# Patient Record
Sex: Male | Born: 1950 | Race: White | Hispanic: No | Marital: Married | Smoking: Former smoker
Health system: Southern US, Community
[De-identification: ages and names within clinical notes are randomized; demographics above are authoritative.]

## PROBLEM LIST (undated history)

## (undated) DIAGNOSIS — J8410 Pulmonary fibrosis, unspecified: Secondary | ICD-10-CM

## (undated) HISTORY — PX: OTHER SURGICAL HISTORY: SHX169

## (undated) HISTORY — PX: SHOULDER ARTHROSCOPY: SHX128

## (undated) HISTORY — DX: Pulmonary fibrosis, unspecified: J84.10

---

## 2009-10-26 ENCOUNTER — Emergency Department (HOSPITAL_BASED_OUTPATIENT_CLINIC_OR_DEPARTMENT_OTHER): Admission: EM | Admit: 2009-10-26 | Discharge: 2009-10-26 | Payer: Self-pay | Admitting: Emergency Medicine

## 2009-11-08 ENCOUNTER — Ambulatory Visit: Payer: Self-pay | Admitting: Family

## 2009-11-08 DIAGNOSIS — F17200 Nicotine dependence, unspecified, uncomplicated: Secondary | ICD-10-CM

## 2009-11-08 DIAGNOSIS — M542 Cervicalgia: Secondary | ICD-10-CM

## 2009-11-28 ENCOUNTER — Telehealth: Payer: Self-pay | Admitting: Family

## 2009-11-29 ENCOUNTER — Ambulatory Visit: Payer: Self-pay | Admitting: Radiology

## 2009-11-29 ENCOUNTER — Ambulatory Visit (HOSPITAL_BASED_OUTPATIENT_CLINIC_OR_DEPARTMENT_OTHER): Admission: RE | Admit: 2009-11-29 | Discharge: 2009-11-29 | Payer: Self-pay | Admitting: Family

## 2009-12-02 ENCOUNTER — Telehealth: Payer: Self-pay | Admitting: Family

## 2009-12-10 ENCOUNTER — Encounter: Payer: Self-pay | Admitting: Internal Medicine

## 2009-12-10 ENCOUNTER — Encounter: Admission: RE | Admit: 2009-12-10 | Discharge: 2010-01-21 | Payer: Self-pay | Admitting: Internal Medicine

## 2010-08-31 ENCOUNTER — Encounter: Payer: Self-pay | Admitting: Internal Medicine

## 2010-09-09 NOTE — Progress Notes (Signed)
  Phone Note Call from Patient   Caller: Patient Details for Reason: cervical MRI  Summary of Call: Ins sent pt a denial letter inreferance to  pt's MRI   stating he has not had any other   treatment  x-ray -ct or anti-inflamatory meds .Please advise. Initial call taken by: Darral Dash,  November 28, 2009 8:32 AM  Follow-up for Phone Call        Called patient.  He tells me that his neck pain is unchanged.  MRI was denied.  Will send for X-ray of the C spine.  Pt wishes to do tomorrow.  Will leave slips with the front desk in radiology department.  Follow-up by: Lemont Fillers FNP,  November 28, 2009 10:52 AM

## 2010-09-09 NOTE — Assessment & Plan Note (Signed)
Summary: new to be est/mhf   Vital Signs:  Patient profile:   60 year old male Height:      72 inches Weight:      191 pounds BMI:     26.00 Temp:     97.8 degrees F oral Pulse rate:   108 / minute Pulse rhythm:   regular Resp:     18 per minute BP sitting:   128 / 80  (right arm) Cuff size:   regular  Vitals Entered By: Anthony Wilkerson CMA (November 08, 2009 1:40 PM) CC: room 4   Neck pain x 3 1/2 months. Pt was seen in ER 10/26/09. Is Patient Diabetic? No   CC:  room 4   Neck pain x 3 1/2 months. Pt was seen in ER 10/26/09.Marland Kitchen  History of Present Illness: Anthony Wilkerson is a 60 year old male who presents today to establish care.  Notes that he has had neck pain x 3.5 months.  Did have a fall 6 months ago.  Otherwise, no known trauma.  Pain is worse with movement- especially turning head side to side. Denies pain when at rest.  Has not tried any OTC meds.  Pain is worse in the morning.  Denies any associated hand numbness or weakness.   Patient tells me that he has not had a primary care physician since 1995.  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 Lungs:  Normal respiratory effort, chest expands symmetrically. Lungs are clear to auscultation, no crackles or wheezes. Heart:  Normal rate and regular rhythm. S1 and S2 normal without gallop, murmur, click, rub or other extra sounds. Msk:  Patient with some difficulty fully turning head to right and left.   Extremities:  +clubbing Neurologic:  strength normal in all extremities.   Skin:  + psoriasis noted on hands   Impression & Recommendations:  Problem # 1:  NECK PAIN (ICD-723.1) Assessment New Patient is requesting MRI, will request prior authorization.  If this is not approved, will order plain films.  Patient aware of plan.  I due suspect that patient's symptoms are related to arthritis- I suggested NSAIDS.  Pt will also need a preventative care visit scheduled.   Orders: Misc. Referral (Misc. Ref)  Problem # 2:  TOBACCO ABUSE (ICD-305.1) Assessment: Comment Only  Pt noted to have + clubbing on exam.  I counseled patient on importance of quitting smoking.   Orders: Tobacco  use cessation intermediate 3-10 minutes ([id number])  Patient Instructions: 1)  Please follow up fasting for a complete physical. 2)  You will be contacted about your referral for MRI. 3)  Take 400-600mg  of Ibuprofen (Advil, Motrin) with food every 4-6 hours as needed for relief of pain. 4)  Tobacco is very bad for your health and your loved ones! You Should stop smoking!. 5)  It was a pleasure to meet you!

## 2010-09-09 NOTE — Miscellaneous (Signed)
Summary: PT Initial Summary/[facility]  PT Initial Summary/[facility]   Imported By: Lanelle Bal 12/16/2009 11:12:47  _____________________________________________________________________  External Attachment:    Type:   Image     Comment:   External Document

## 2010-09-09 NOTE — Progress Notes (Signed)
  Phone Note Outgoing Call   Summary of Call: Called patient, reviewed results of C-Spine X-ray with patient (DDD- moderate foraminal narrowing).  Pt notes that his pain is unchanged.  He has not tried NSAIDS.  I recommended Aleve and PT referral.  Patient agrees. I recommended follow up in 1-2 months. Initial call taken by: Lemont Fillers FNP,  December 02, 2009 9:47 AM

## 2011-07-07 IMAGING — CR DG CERVICAL SPINE WITH FLEX & EXTEND
7 series · 7 of 7 positions shown · non-contrast
Comparison: None.

CLINICAL DATA: Neck pain - no known injury

CERVICAL SPINE COMPLETE WITH FLEXION AND EXTENSION VIEWS

[w c-spine lat]
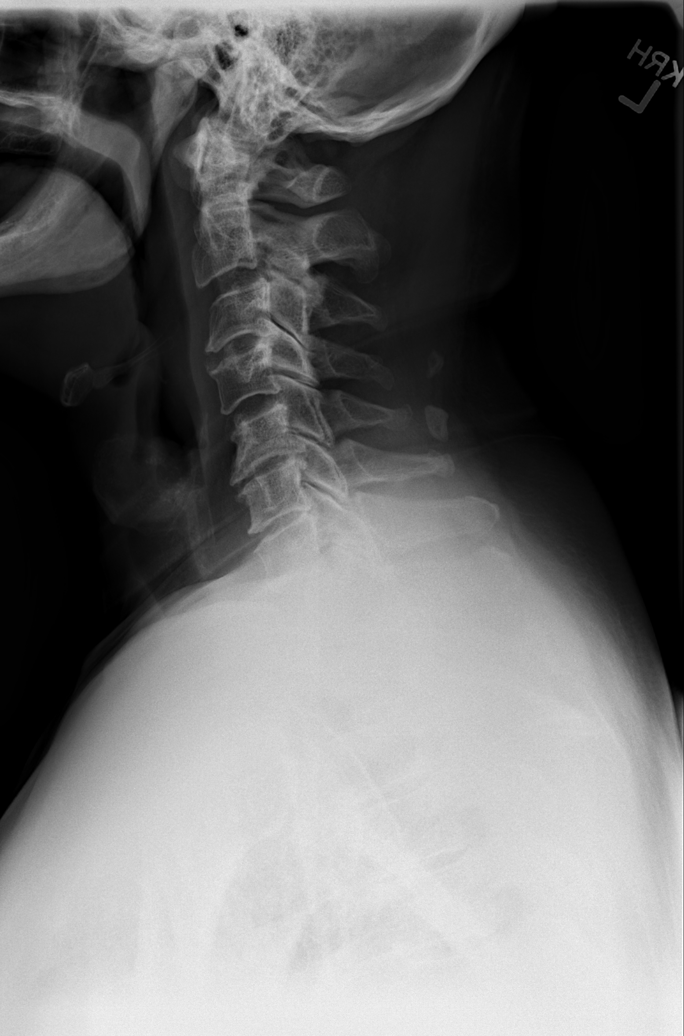

[w c-spine flexion]
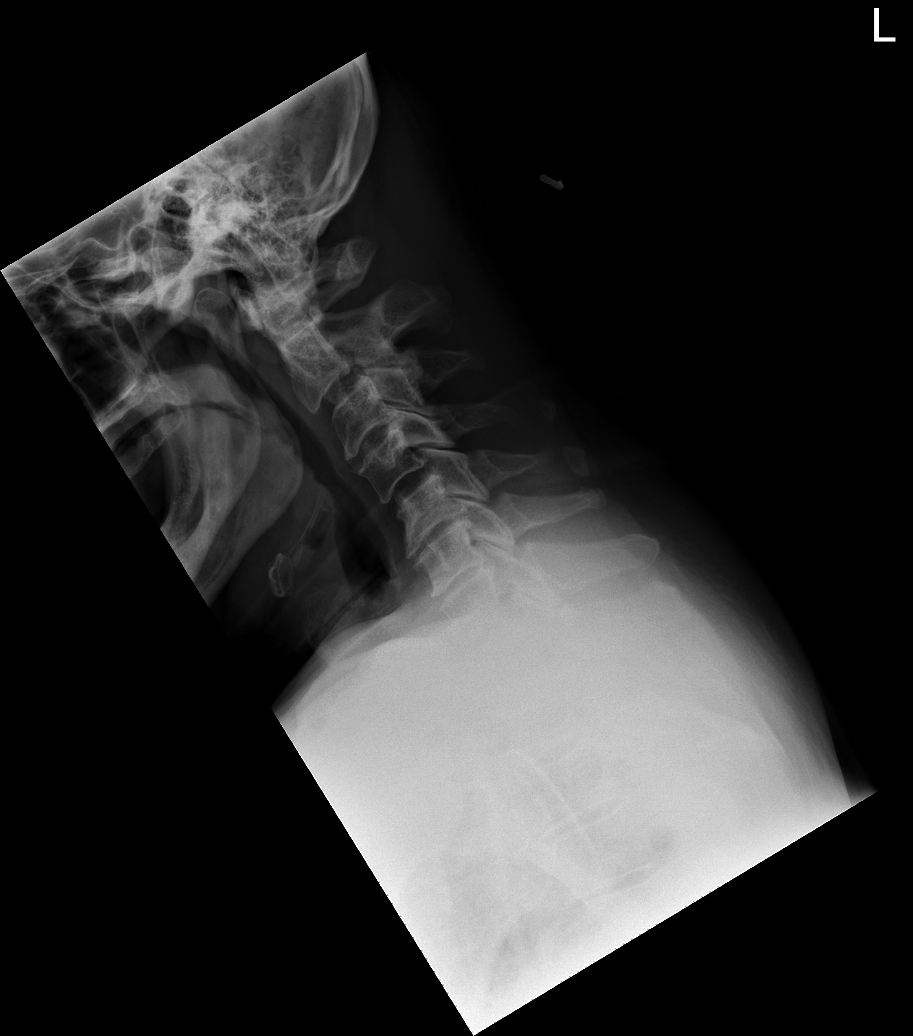

[w c-spine extension]
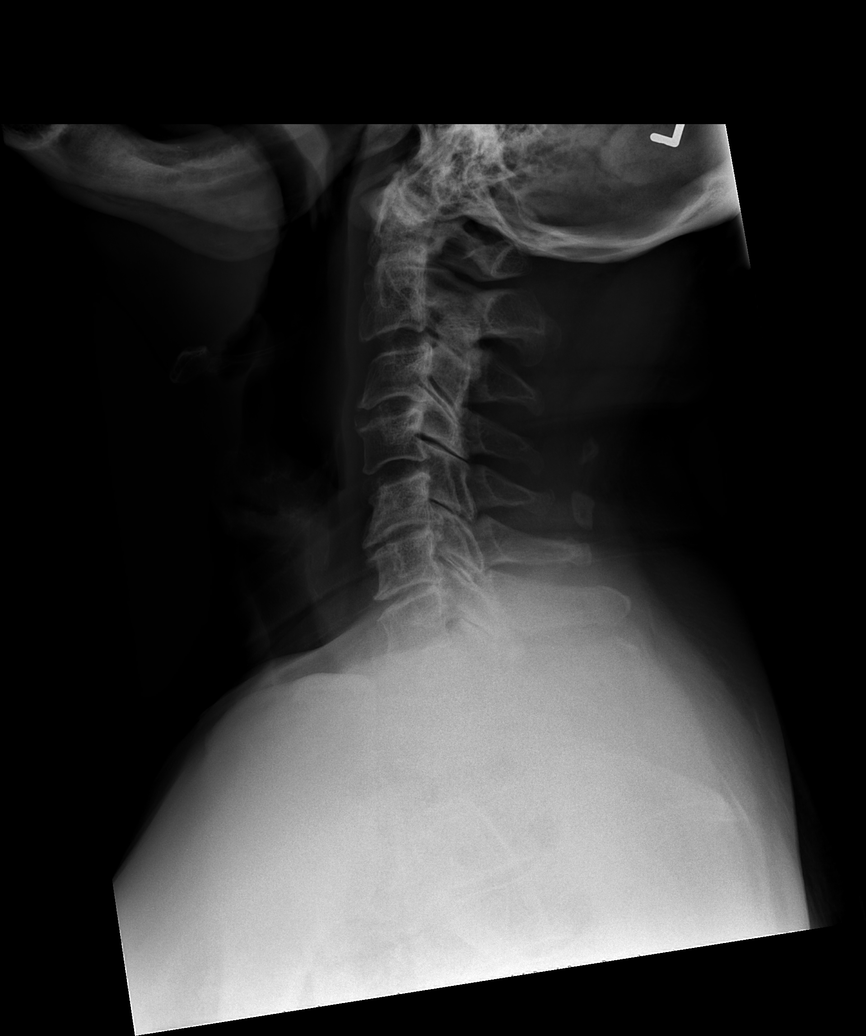

[w c-spine oblique (1 of 2)]
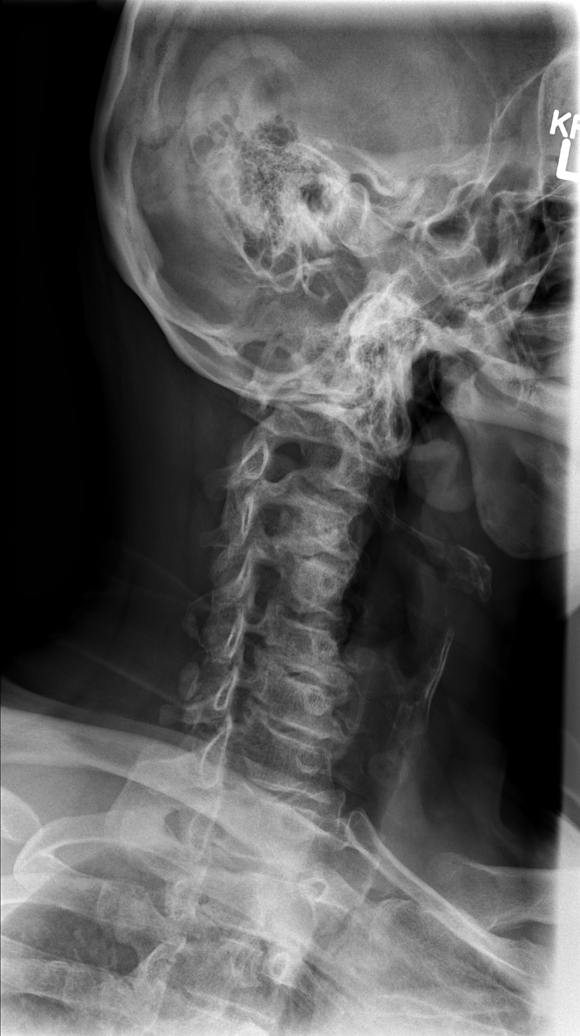

[w c-spine oblique (2 of 2)]
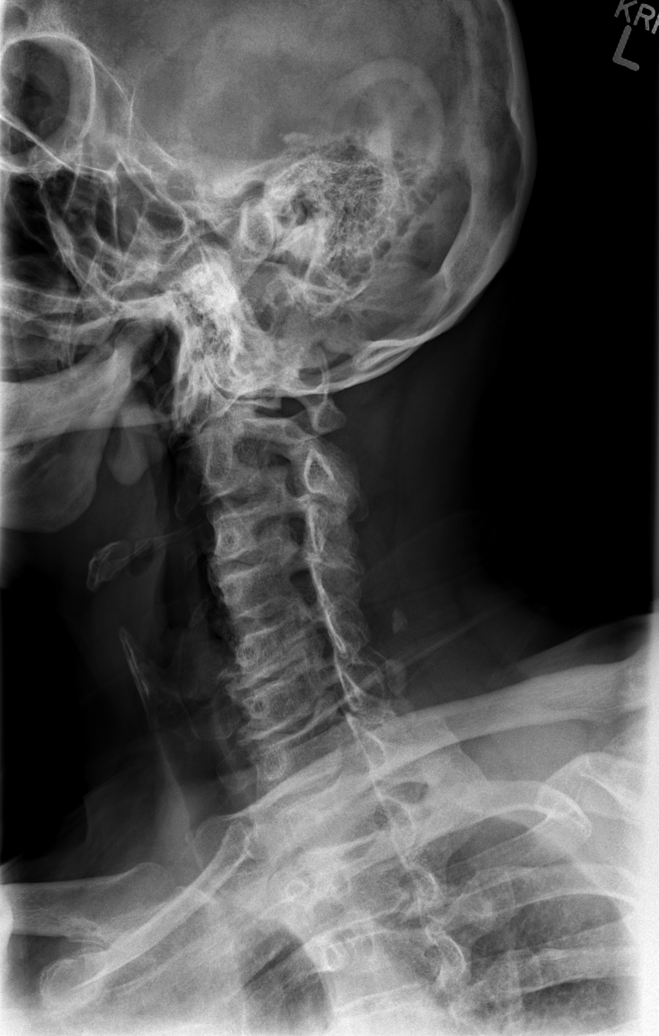

[w c-spine a.p.]
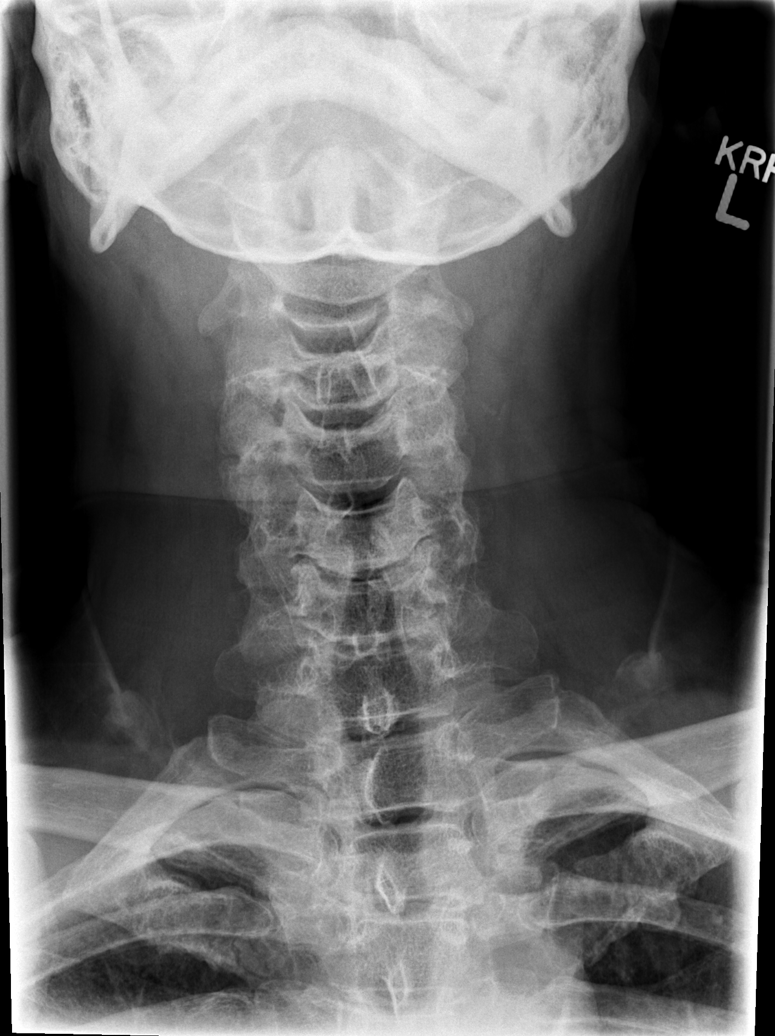

[w c-spine odontoid]
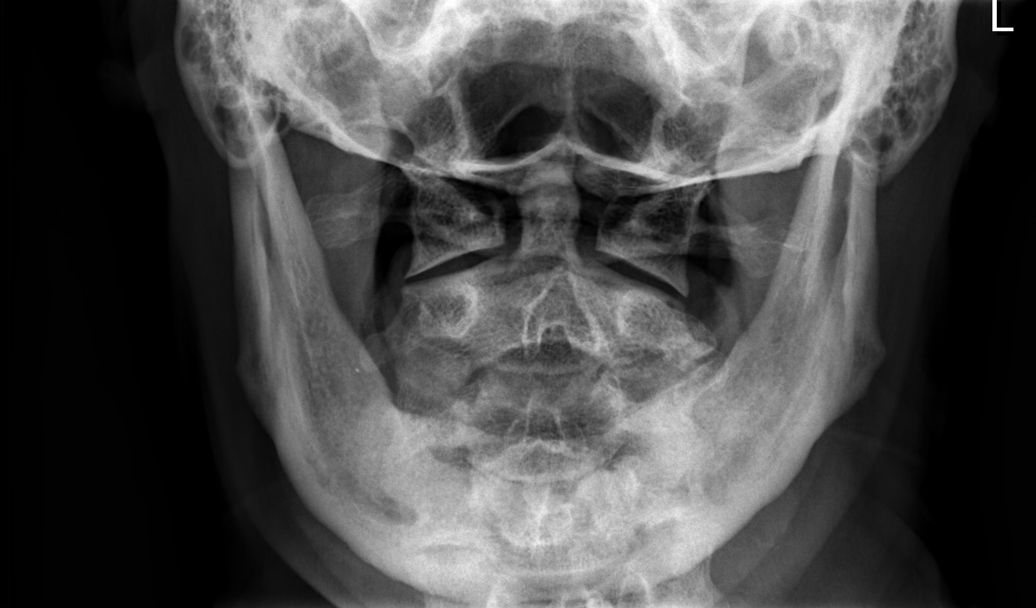

[7 of 7 positions shown; findings below may reference images not displayed]

FINDINGS: Routine views plus lateral views with flexion and
extension were obtained.

There is degenerative disc disease most notably at C5-6 and C6-7.
There is moderate foraminal narrowing on the right at these levels.
No fracture or subluxation.  No instability demonstrated through
flexion or extension.
IMPRESSION: Degenerative changes as above.  No acute findings.

## 2016-08-17 DIAGNOSIS — Z79899 Other long term (current) drug therapy: Secondary | ICD-10-CM | POA: Diagnosis not present

## 2016-08-17 DIAGNOSIS — J984 Other disorders of lung: Secondary | ICD-10-CM | POA: Diagnosis not present

## 2016-08-17 DIAGNOSIS — J449 Chronic obstructive pulmonary disease, unspecified: Secondary | ICD-10-CM | POA: Diagnosis not present

## 2016-08-17 DIAGNOSIS — J84112 Idiopathic pulmonary fibrosis: Secondary | ICD-10-CM | POA: Diagnosis not present

## 2016-08-17 DIAGNOSIS — J849 Interstitial pulmonary disease, unspecified: Secondary | ICD-10-CM | POA: Diagnosis not present

## 2016-10-24 DIAGNOSIS — L409 Psoriasis, unspecified: Secondary | ICD-10-CM | POA: Diagnosis not present

## 2016-10-31 DIAGNOSIS — L403 Pustulosis palmaris et plantaris: Secondary | ICD-10-CM | POA: Diagnosis not present

## 2016-11-20 DIAGNOSIS — J849 Interstitial pulmonary disease, unspecified: Secondary | ICD-10-CM | POA: Diagnosis not present

## 2016-11-20 DIAGNOSIS — J84112 Idiopathic pulmonary fibrosis: Secondary | ICD-10-CM | POA: Diagnosis not present

## 2016-11-20 DIAGNOSIS — Z79899 Other long term (current) drug therapy: Secondary | ICD-10-CM | POA: Diagnosis not present

## 2016-11-20 DIAGNOSIS — L409 Psoriasis, unspecified: Secondary | ICD-10-CM | POA: Diagnosis not present

## 2016-11-20 DIAGNOSIS — J449 Chronic obstructive pulmonary disease, unspecified: Secondary | ICD-10-CM | POA: Diagnosis not present

## 2016-11-20 DIAGNOSIS — J984 Other disorders of lung: Secondary | ICD-10-CM | POA: Diagnosis not present

## 2016-12-25 DIAGNOSIS — L400 Psoriasis vulgaris: Secondary | ICD-10-CM | POA: Diagnosis not present

## 2016-12-25 DIAGNOSIS — L409 Psoriasis, unspecified: Secondary | ICD-10-CM | POA: Diagnosis not present

## 2017-01-27 DIAGNOSIS — R0602 Shortness of breath: Secondary | ICD-10-CM | POA: Diagnosis not present

## 2017-01-27 DIAGNOSIS — J8410 Pulmonary fibrosis, unspecified: Secondary | ICD-10-CM | POA: Diagnosis not present

## 2017-01-27 DIAGNOSIS — J84113 Idiopathic non-specific interstitial pneumonitis: Secondary | ICD-10-CM | POA: Diagnosis not present

## 2017-01-27 DIAGNOSIS — Z79899 Other long term (current) drug therapy: Secondary | ICD-10-CM | POA: Diagnosis not present

## 2017-01-27 DIAGNOSIS — L409 Psoriasis, unspecified: Secondary | ICD-10-CM | POA: Diagnosis not present

## 2017-01-27 DIAGNOSIS — J84111 Idiopathic interstitial pneumonia, not otherwise specified: Secondary | ICD-10-CM | POA: Diagnosis not present

## 2017-01-27 DIAGNOSIS — J431 Panlobular emphysema: Secondary | ICD-10-CM | POA: Diagnosis not present

## 2017-01-27 DIAGNOSIS — F17210 Nicotine dependence, cigarettes, uncomplicated: Secondary | ICD-10-CM | POA: Diagnosis not present

## 2017-02-06 DIAGNOSIS — M545 Low back pain: Secondary | ICD-10-CM | POA: Diagnosis not present

## 2017-03-05 DIAGNOSIS — J431 Panlobular emphysema: Secondary | ICD-10-CM | POA: Diagnosis not present

## 2017-03-05 DIAGNOSIS — L409 Psoriasis, unspecified: Secondary | ICD-10-CM | POA: Diagnosis not present

## 2017-03-05 DIAGNOSIS — J849 Interstitial pulmonary disease, unspecified: Secondary | ICD-10-CM | POA: Diagnosis not present

## 2017-03-11 DIAGNOSIS — L270 Generalized skin eruption due to drugs and medicaments taken internally: Secondary | ICD-10-CM | POA: Diagnosis not present

## 2017-03-12 DIAGNOSIS — L309 Dermatitis, unspecified: Secondary | ICD-10-CM | POA: Diagnosis not present

## 2017-03-12 DIAGNOSIS — L270 Generalized skin eruption due to drugs and medicaments taken internally: Secondary | ICD-10-CM | POA: Diagnosis not present

## 2017-04-05 DIAGNOSIS — J84113 Idiopathic non-specific interstitial pneumonitis: Secondary | ICD-10-CM | POA: Diagnosis not present

## 2017-05-05 DIAGNOSIS — L570 Actinic keratosis: Secondary | ICD-10-CM | POA: Diagnosis not present

## 2017-05-05 DIAGNOSIS — L561 Drug photoallergic response: Secondary | ICD-10-CM | POA: Diagnosis not present

## 2017-05-05 DIAGNOSIS — L400 Psoriasis vulgaris: Secondary | ICD-10-CM | POA: Diagnosis not present

## 2017-05-15 DIAGNOSIS — J439 Emphysema, unspecified: Secondary | ICD-10-CM | POA: Diagnosis not present

## 2017-05-15 DIAGNOSIS — R918 Other nonspecific abnormal finding of lung field: Secondary | ICD-10-CM | POA: Diagnosis not present

## 2017-07-28 DIAGNOSIS — L409 Psoriasis, unspecified: Secondary | ICD-10-CM | POA: Diagnosis not present

## 2017-07-28 DIAGNOSIS — J431 Panlobular emphysema: Secondary | ICD-10-CM | POA: Diagnosis not present

## 2017-07-28 DIAGNOSIS — L568 Other specified acute skin changes due to ultraviolet radiation: Secondary | ICD-10-CM | POA: Diagnosis not present

## 2017-07-28 DIAGNOSIS — J84111 Idiopathic interstitial pneumonia, not otherwise specified: Secondary | ICD-10-CM | POA: Diagnosis not present

## 2017-07-28 DIAGNOSIS — R0602 Shortness of breath: Secondary | ICD-10-CM | POA: Diagnosis not present

## 2017-07-30 DIAGNOSIS — H43813 Vitreous degeneration, bilateral: Secondary | ICD-10-CM | POA: Diagnosis not present

## 2017-07-30 DIAGNOSIS — H2513 Age-related nuclear cataract, bilateral: Secondary | ICD-10-CM | POA: Diagnosis not present

## 2017-08-11 DIAGNOSIS — L409 Psoriasis, unspecified: Secondary | ICD-10-CM | POA: Diagnosis not present

## 2017-08-11 DIAGNOSIS — J8410 Pulmonary fibrosis, unspecified: Secondary | ICD-10-CM | POA: Diagnosis not present

## 2017-08-11 DIAGNOSIS — E782 Mixed hyperlipidemia: Secondary | ICD-10-CM | POA: Diagnosis not present

## 2017-08-11 DIAGNOSIS — L400 Psoriasis vulgaris: Secondary | ICD-10-CM | POA: Diagnosis not present

## 2017-08-11 DIAGNOSIS — E781 Pure hyperglyceridemia: Secondary | ICD-10-CM | POA: Diagnosis not present

## 2017-11-08 DIAGNOSIS — E781 Pure hyperglyceridemia: Secondary | ICD-10-CM | POA: Diagnosis not present

## 2017-11-08 DIAGNOSIS — L409 Psoriasis, unspecified: Secondary | ICD-10-CM | POA: Diagnosis not present

## 2017-11-08 DIAGNOSIS — Z0000 Encounter for general adult medical examination without abnormal findings: Secondary | ICD-10-CM | POA: Diagnosis not present

## 2017-11-08 DIAGNOSIS — E782 Mixed hyperlipidemia: Secondary | ICD-10-CM | POA: Diagnosis not present

## 2017-12-06 DIAGNOSIS — Z5320 Procedure and treatment not carried out because of patient's decision for unspecified reasons: Secondary | ICD-10-CM | POA: Diagnosis not present

## 2017-12-06 DIAGNOSIS — L409 Psoriasis, unspecified: Secondary | ICD-10-CM | POA: Diagnosis not present

## 2017-12-06 DIAGNOSIS — E119 Type 2 diabetes mellitus without complications: Secondary | ICD-10-CM | POA: Diagnosis not present

## 2017-12-06 DIAGNOSIS — E1165 Type 2 diabetes mellitus with hyperglycemia: Secondary | ICD-10-CM | POA: Diagnosis not present

## 2017-12-06 DIAGNOSIS — R7301 Impaired fasting glucose: Secondary | ICD-10-CM | POA: Diagnosis not present

## 2018-01-31 DIAGNOSIS — L409 Psoriasis, unspecified: Secondary | ICD-10-CM | POA: Diagnosis not present

## 2018-01-31 DIAGNOSIS — J431 Panlobular emphysema: Secondary | ICD-10-CM | POA: Diagnosis not present

## 2018-01-31 DIAGNOSIS — R21 Rash and other nonspecific skin eruption: Secondary | ICD-10-CM | POA: Diagnosis not present

## 2018-01-31 DIAGNOSIS — J84112 Idiopathic pulmonary fibrosis: Secondary | ICD-10-CM | POA: Diagnosis not present

## 2018-01-31 DIAGNOSIS — Z87891 Personal history of nicotine dependence: Secondary | ICD-10-CM | POA: Diagnosis not present

## 2018-01-31 DIAGNOSIS — J84111 Idiopathic interstitial pneumonia, not otherwise specified: Secondary | ICD-10-CM | POA: Diagnosis not present

## 2018-03-07 DIAGNOSIS — Z7984 Long term (current) use of oral hypoglycemic drugs: Secondary | ICD-10-CM | POA: Diagnosis not present

## 2018-03-07 DIAGNOSIS — J8410 Pulmonary fibrosis, unspecified: Secondary | ICD-10-CM | POA: Diagnosis not present

## 2018-03-07 DIAGNOSIS — L400 Psoriasis vulgaris: Secondary | ICD-10-CM | POA: Diagnosis not present

## 2018-03-07 DIAGNOSIS — E1165 Type 2 diabetes mellitus with hyperglycemia: Secondary | ICD-10-CM | POA: Diagnosis not present

## 2018-03-07 DIAGNOSIS — E781 Pure hyperglyceridemia: Secondary | ICD-10-CM | POA: Diagnosis not present

## 2018-05-31 DIAGNOSIS — M25511 Pain in right shoulder: Secondary | ICD-10-CM | POA: Diagnosis not present

## 2018-05-31 DIAGNOSIS — S46211D Strain of muscle, fascia and tendon of other parts of biceps, right arm, subsequent encounter: Secondary | ICD-10-CM | POA: Diagnosis not present

## 2018-06-10 DIAGNOSIS — S46811A Strain of other muscles, fascia and tendons at shoulder and upper arm level, right arm, initial encounter: Secondary | ICD-10-CM | POA: Diagnosis not present

## 2018-06-10 DIAGNOSIS — M19011 Primary osteoarthritis, right shoulder: Secondary | ICD-10-CM | POA: Diagnosis not present

## 2018-06-10 DIAGNOSIS — M7521 Bicipital tendinitis, right shoulder: Secondary | ICD-10-CM | POA: Diagnosis not present

## 2018-06-10 DIAGNOSIS — M25411 Effusion, right shoulder: Secondary | ICD-10-CM | POA: Diagnosis not present

## 2018-06-10 DIAGNOSIS — S46011A Strain of muscle(s) and tendon(s) of the rotator cuff of right shoulder, initial encounter: Secondary | ICD-10-CM | POA: Diagnosis not present

## 2018-06-10 DIAGNOSIS — M7581 Other shoulder lesions, right shoulder: Secondary | ICD-10-CM | POA: Diagnosis not present

## 2018-08-08 DIAGNOSIS — M25511 Pain in right shoulder: Secondary | ICD-10-CM | POA: Diagnosis not present

## 2018-08-20 DIAGNOSIS — M25511 Pain in right shoulder: Secondary | ICD-10-CM | POA: Diagnosis not present

## 2018-08-24 DIAGNOSIS — M25511 Pain in right shoulder: Secondary | ICD-10-CM | POA: Diagnosis not present

## 2018-09-12 DIAGNOSIS — E1165 Type 2 diabetes mellitus with hyperglycemia: Secondary | ICD-10-CM | POA: Diagnosis not present

## 2018-09-12 DIAGNOSIS — L400 Psoriasis vulgaris: Secondary | ICD-10-CM | POA: Diagnosis not present

## 2018-09-12 DIAGNOSIS — J8410 Pulmonary fibrosis, unspecified: Secondary | ICD-10-CM | POA: Diagnosis not present

## 2018-09-12 DIAGNOSIS — E782 Mixed hyperlipidemia: Secondary | ICD-10-CM | POA: Diagnosis not present

## 2019-01-05 DIAGNOSIS — R21 Rash and other nonspecific skin eruption: Secondary | ICD-10-CM | POA: Diagnosis not present

## 2019-01-05 DIAGNOSIS — J431 Panlobular emphysema: Secondary | ICD-10-CM | POA: Diagnosis not present

## 2019-01-05 DIAGNOSIS — L409 Psoriasis, unspecified: Secondary | ICD-10-CM | POA: Diagnosis not present

## 2019-01-05 DIAGNOSIS — R942 Abnormal results of pulmonary function studies: Secondary | ICD-10-CM | POA: Diagnosis not present

## 2019-01-05 DIAGNOSIS — Z87891 Personal history of nicotine dependence: Secondary | ICD-10-CM | POA: Diagnosis not present

## 2019-01-05 DIAGNOSIS — J84111 Idiopathic interstitial pneumonia, not otherwise specified: Secondary | ICD-10-CM | POA: Diagnosis not present

## 2019-01-05 DIAGNOSIS — J84112 Idiopathic pulmonary fibrosis: Secondary | ICD-10-CM | POA: Diagnosis not present

## 2019-01-05 DIAGNOSIS — R0602 Shortness of breath: Secondary | ICD-10-CM | POA: Diagnosis not present

## 2019-02-01 ENCOUNTER — Other Ambulatory Visit: Payer: Self-pay

## 2019-02-01 ENCOUNTER — Ambulatory Visit (INDEPENDENT_AMBULATORY_CARE_PROVIDER_SITE_OTHER): Payer: Worker's Compensation | Admitting: Orthopaedic Surgery

## 2019-02-01 ENCOUNTER — Encounter: Payer: Self-pay | Admitting: Orthopaedic Surgery

## 2019-02-01 VITALS — BP 125/73 | HR 68 | Ht 71.0 in | Wt 197.0 lb

## 2019-02-01 DIAGNOSIS — G8929 Other chronic pain: Secondary | ICD-10-CM

## 2019-02-01 DIAGNOSIS — M25511 Pain in right shoulder: Secondary | ICD-10-CM

## 2019-02-01 DIAGNOSIS — M25512 Pain in left shoulder: Secondary | ICD-10-CM

## 2019-02-01 NOTE — Progress Notes (Addendum)
Office Visit Note   Patient: Anthony Wilkerson           Date of Birth: 11/22/1950           MRN: [id number] Visit Date: 02/01/2019              Requested by: No referring provider defined for this encounter. PCP: Patient, No Pcp Per   Assessment & Plan: Visit Diagnoses:  1. Chronic right shoulder pain   2. Chronic left shoulder pain     Plan: On-the-job injury over 4 months ago with some persistent left shoulder pain.  MRI scan demonstrates supraspinatus partial thickness partial width tear as well as infraspinatus tendinosis with mild partial tearing there is some tendinosis of the subscapularis.  Biceps is intact with some tendinosis.  There is also extensive AC joint degenerative changes as well as subacromial bursitis.  There is a small glenohumeral joint effusion.  I discussed at length with Anthony Wilkerson all of the above including the diagnosis and different treatment options including another course of physical therapy, cortisone injections, anti-inflammatory medicines or arthroscopic debridement.  Anthony Wilkerson would like to give it more time continue with over-the-counter medicines.  We will reevaluate in 6 weeks.  Would like to continue to work without restrictions and I think is fine. Office visit over 45 minutes.  I spent approximately 45 minutes reviewing all of his records and scan reports that were previously forwarded to the office  Follow-Up Instructions: Return in about 6 weeks (around 03/15/2019).   Orders:  No orders of the defined types were placed in this encounter.  No orders of the defined types were placed in this encounter.     Procedures: No procedures performed   Clinical Data: No additional findings.   Subjective: Chief Complaint  Patient presents with  . Left Shoulder - Pain  Patient presents today for an IME on his left shoulder. He injured his shoulder on 10/21/2018 when he was moving a piece of kitchen equipment. He said that afterwards he started  feeling pain in his shoulder. He had x-rays and an MRI. He has went through therapy, no improvement. He takes 600mg  Ibuprofen four times daily.  He has some weakness in his arm. His range of motion is okay as long as he goes slowly.  Anthony Wilkerson is 68 years old and injured his left shoulder while on the job on 21 October 2018.  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  He was pushing a 252  to300 pound frier across the floor when he experienced immediate onset of pain in the left shoulder.  At the time he was in [place].  With persistent pain and about a week later he was evaluated in [place] where he lives by [place].  He was diagnosed with a shoulder strain and referred to physical therapy.  He underwent a course of 6 sessions.  He has been furloughed over the last several months because of the COVID virus and has not been active with his shoulder.  He notes he still has some discomfort with certain activities but is better.  His past history is significant that he underwent an open procedure to the same shoulder while living in [place] in 1994.  He was having little or any discomfort with that shoulder prior to this particular injury.  He is not had any numbness or tingling.  He does have a history of psoriasis.  I have reviewed his office notes  as well as his MRI scan report and discussed them with Anthony Wilkerson.  He plans to return to work tomorrow. He has a second Workmen's Comp. claim referable to his right shoulder and appears to have been seen by Dr. Rennis ChrisSupple at [facility]  HPI  Review of Systems   Objective: Vital Signs: BP 125/73   Pulse 68   Ht 5\' 11"  (1.803 m)   Wt 197 lb (89.4 kg)   BMI 27.48 kg/m   Physical Exam Constitutional:      Appearance: He is well-developed.  Eyes:     Pupils: Pupils are equal, round, and reactive to light.  Pulmonary:     Effort: Pulmonary effort is normal.  Skin:    General: Skin is warm and dry.   Neurological:     Mental Status: He is alert and oriented to person, place, and time.  Psychiatric:        Behavior: Behavior normal.     Ortho Exam psoriatic lesions in both arms.  Left shoulder with minimally positive impingement testing.  Able to place arm fully overhead with slightly circuitous motion.  Minimal loss of internal rotation.  Good strength.  Biceps intact shoulder skin intact.  Negative empty can testing.  No significant tenderness over the [facility] but there is some bulky change.  Minimal discomfort in the anterior subacromial region.  No pain with range of motion of the cervical spine.  Good grip and good release.  Specialty Comments:  No specialty comments available.  Imaging: No results found.   PMFS History: Patient Active Problem List   Diagnosis Date Noted  . Pain in right shoulder 03/15/2019  . TOBACCO ABUSE 11/08/2009  . NECK PAIN 11/08/2009   Past Medical History:  Diagnosis Date  . Pulmonary fibrosis (H[facility])     History reviewed. No pertinent family history.  Past Surgical History:  Procedure Laterality Date  . pulmonary fibrosis     ERROR  . SHOULDER ARTHROSCOPY Left    Social History   Occupational History  . Not on file  Tobacco Use  . Smoking status: Former Games developermoker  . Smokeless tobacco: Never Used  Substance and Sexual Activity  . Alcohol use: Yes  . Drug use: Not on file  . Sexual activity: Not on file

## 2019-03-15 ENCOUNTER — Encounter: Payer: Self-pay | Admitting: Orthopaedic Surgery

## 2019-03-15 ENCOUNTER — Other Ambulatory Visit: Payer: Self-pay

## 2019-03-15 ENCOUNTER — Ambulatory Visit (INDEPENDENT_AMBULATORY_CARE_PROVIDER_SITE_OTHER): Payer: Worker's Compensation | Admitting: Orthopaedic Surgery

## 2019-03-15 ENCOUNTER — Telehealth: Payer: Self-pay

## 2019-03-15 VITALS — BP 116/69 | HR 58 | Ht 71.0 in | Wt 197.0 lb

## 2019-03-15 DIAGNOSIS — M25511 Pain in right shoulder: Secondary | ICD-10-CM | POA: Insufficient documentation

## 2019-03-15 DIAGNOSIS — E782 Mixed hyperlipidemia: Secondary | ICD-10-CM | POA: Diagnosis not present

## 2019-03-15 DIAGNOSIS — E1165 Type 2 diabetes mellitus with hyperglycemia: Secondary | ICD-10-CM | POA: Diagnosis not present

## 2019-03-15 DIAGNOSIS — M25512 Pain in left shoulder: Secondary | ICD-10-CM

## 2019-03-15 DIAGNOSIS — G8929 Other chronic pain: Secondary | ICD-10-CM | POA: Diagnosis not present

## 2019-03-15 DIAGNOSIS — L409 Psoriasis, unspecified: Secondary | ICD-10-CM | POA: Diagnosis not present

## 2019-03-15 DIAGNOSIS — J8410 Pulmonary fibrosis, unspecified: Secondary | ICD-10-CM | POA: Diagnosis not present

## 2019-03-15 NOTE — Telephone Encounter (Signed)
email from adj states the [facility] injury is for left shoulder and right shoulder was rated. Is there any rating for left shoulder?

## 2019-03-15 NOTE — Progress Notes (Addendum)
Office Visit Note   Patient: Anthony SacramentoKenneth Morriss           Date of Birth: [birth year]           MRN: [id number] Visit Date: 03/15/2019              Requested by: No referring provider defined for this encounter. PCP: Patient, No Pcp Per   Assessment & Plan: Visit Diagnoses:  1. Chronic left shoulder pain   2. Chronic right shoulder pain     Plan: Mr. [name] was seen for an independent medical evaluation on June 24 related to his on-the-job injury of the left shoulder.  He had a prior on-the-job injury to his left shoulder while living in [place] with  rotator cuff surgery.  Long discussion regarding his shoulder symptoms and review of an MRI scan.  To date he is working and takes an occasional Advil for pain.  I have reviewed all of the different treatment options and he is fine for the moment continuing with his work and occasional Advil.  He does not want to consider any further treatment or surgery.  He will have a 5% permanent partial impairment to the left upper extremity above and beyond any prior rating and I will plan to see him back as needed. There was some confusion today regarding which shoulder to evaluate.  He has an ongoing issue with his right shoulder which is unrelated to this particular claim for the left shoulder.  Apparently that is being evaluated elsewhere  Follow-Up Instructions: Return if symptoms worsen or fail to improve.   Orders:  No orders of the defined types were placed in this encounter.  No orders of the defined types were placed in this encounter.     Procedures: No procedures performed   Clinical Data: No additional findings.   Subjective: Patient presents today for follow up on his left shoulder. No changes since his last visit. He has been taking Ibuprofen, but has limited his amount.  Working full-time without any significant problems.  Occasionally will take some ibuprofen 1 or 2 at a time with good relief.  Does not appear to have any  significant compromise of his activities.  No numbness or tingling.  Has had some discomfort with his left shoulder that had previously been injured in a Workmen's Comp. injury years ago with successful surgery for rotator cuff tear. As a review MRI scan of his left shoulder was performed without IV contrast on December 07, 2018.  The scan demonstrated supraspinatus partial thickness partial width tear with persistent surface extension.  There was mild partial tearing of the anteriormost fibers of the infraspinatus.  There was some articular surface fraying of the subscapularis.  There was tendinosis of the long head of the biceps tendon.  Extensive AC joint degenerative changes with subacromial subdeltoid bursitis and glenohumeral joint effusion  HPI  Review of Systems   Objective: Vital Signs: BP 116/69   Pulse (!) 58   Ht 5\' 11"  (1.803 m)   Wt 197 lb (89.4 kg)   BMI 27.48 kg/m   Physical Exam Constitutional:      Appearance: He is well-developed.  Eyes:     Pupils: Pupils are equal, round, and reactive to light.  Pulmonary:     Effort: Pulmonary effort is normal.  Skin:    General: Skin is warm and dry.  Neurological:     Mental Status: He is alert and oriented to person, place, and time.  Psychiatric:  Behavior: Behavior normal.     Ortho Exam awake alert and oriented x3.  Comfortable sitting.  He had full quick overhead motion of his left shoulder.  Minimal tenderness along the anterior lateral subacromial region and very minimal impingement symptoms.  Biceps intact.  Excellent strength.  No grinding or grating.  Full overhead motion and internal rotation.  Abduction without pain.  Specialty Comments:  No specialty comments available.  Imaging: No results found.   PMFS History: Patient Active Problem List   Diagnosis Date Noted  . Pain in right shoulder 03/15/2019  . TOBACCO ABUSE 11/08/2009  . NECK PAIN 11/08/2009   Past Medical History:  Diagnosis Date  .  Pulmonary fibrosis ([place])     History reviewed. No pertinent family history.  Past Surgical History:  Procedure Laterality Date  . pulmonary fibrosis     ERROR  . SHOULDER ARTHROSCOPY Left    Social History   Occupational History  . Not on file  Tobacco Use  . Smoking status: Former Research scientist (life sciences)  . Smokeless tobacco: Never Used  Substance and Sexual Activity  . Alcohol use: Yes  . Drug use: Not on file  . Sexual activity: Not on file

## 2019-03-16 ENCOUNTER — Encounter: Payer: Self-pay | Admitting: Orthopaedic Surgery

## 2019-03-16 NOTE — Telephone Encounter (Signed)
Spoke with insurance adjuster re right vs left shoulder-some confusion based on the fact that he has ongoing claims for [employer]. injuries to both shoulders.  I am specifically evaluating the left shoulder .will correct any notes to the contrary

## 2019-03-16 NOTE — Telephone Encounter (Signed)
Emailed the corrected office notes to [email or web address] @ [email or web address])

## 2019-05-13 DIAGNOSIS — L03019 Cellulitis of unspecified finger: Secondary | ICD-10-CM | POA: Diagnosis not present

## 2019-05-13 DIAGNOSIS — B351 Tinea unguium: Secondary | ICD-10-CM | POA: Diagnosis not present

## 2019-11-03 DIAGNOSIS — Z7984 Long term (current) use of oral hypoglycemic drugs: Secondary | ICD-10-CM | POA: Diagnosis not present

## 2019-11-03 DIAGNOSIS — E782 Mixed hyperlipidemia: Secondary | ICD-10-CM | POA: Diagnosis not present

## 2019-11-03 DIAGNOSIS — E1165 Type 2 diabetes mellitus with hyperglycemia: Secondary | ICD-10-CM | POA: Diagnosis not present

## 2020-01-27 DIAGNOSIS — B351 Tinea unguium: Secondary | ICD-10-CM | POA: Diagnosis not present

## 2022-04-29 ENCOUNTER — Encounter (HOSPITAL_COMMUNITY): Payer: Self-pay

## 2022-04-29 ENCOUNTER — Telehealth (HOSPITAL_COMMUNITY): Payer: Self-pay

## 2022-04-29 NOTE — Telephone Encounter (Signed)
Outside/paper referral received by Dr. Zipporah Plants from [facility]. Will fax over Physician order and request further documents. Insurance benefits and eligibility to be determined.

## 2022-04-29 NOTE — Telephone Encounter (Signed)
Attempted to call patient in regards to Pulmonary Rehab - LM on VM Mailed letter 

## 2022-04-30 ENCOUNTER — Telehealth (HOSPITAL_COMMUNITY): Payer: Self-pay

## 2022-04-30 NOTE — Telephone Encounter (Signed)
Pt returned PR phone call and stated he is not interested in PR at this time due.
